# Patient Record
Sex: Male | Born: 1975 | Hispanic: No | Marital: Single | Smoking: Never smoker
Health system: Southern US, Community
[De-identification: ages and names within clinical notes are randomized; demographics above are authoritative.]

---

## 1998-01-09 ENCOUNTER — Emergency Department (HOSPITAL_COMMUNITY): Admission: EM | Admit: 1998-01-09 | Discharge: 1998-01-09 | Payer: Self-pay | Admitting: Emergency Medicine

## 2012-07-20 ENCOUNTER — Emergency Department (HOSPITAL_COMMUNITY)
Admission: EM | Admit: 2012-07-20 | Discharge: 2012-07-20 | Disposition: A | Discharge status: Home / Self Care | Payer: Self-pay | Attending: Emergency Medicine | Admitting: Emergency Medicine

## 2012-07-20 ENCOUNTER — Encounter (HOSPITAL_COMMUNITY): Payer: Self-pay | Admitting: Emergency Medicine

## 2012-07-20 ENCOUNTER — Emergency Department (HOSPITAL_COMMUNITY): Payer: Self-pay

## 2012-07-20 DIAGNOSIS — Y939 Activity, unspecified: Secondary | ICD-10-CM | POA: Insufficient documentation

## 2012-07-20 DIAGNOSIS — Y929 Unspecified place or not applicable: Secondary | ICD-10-CM | POA: Insufficient documentation

## 2012-07-20 DIAGNOSIS — S43004A Unspecified dislocation of right shoulder joint, initial encounter: Secondary | ICD-10-CM

## 2012-07-20 DIAGNOSIS — R209 Unspecified disturbances of skin sensation: Secondary | ICD-10-CM | POA: Insufficient documentation

## 2012-07-20 DIAGNOSIS — W010XXA Fall on same level from slipping, tripping and stumbling without subsequent striking against object, initial encounter: Secondary | ICD-10-CM | POA: Insufficient documentation

## 2012-07-20 DIAGNOSIS — S43006A Unspecified dislocation of unspecified shoulder joint, initial encounter: Secondary | ICD-10-CM | POA: Insufficient documentation

## 2012-07-20 MED ORDER — PROPOFOL 10 MG/ML IV BOLUS
INTRAVENOUS | Status: AC
  Administered 2012-07-20: 40 mg
  Filled 2012-07-20: qty 1

## 2012-07-20 MED ORDER — SODIUM CHLORIDE 0.9 % IV BOLUS (SEPSIS)
500.0000 mL | Freq: Once | INTRAVENOUS | Status: AC
  Administered 2012-07-20: 500 mL via INTRAVENOUS

## 2012-07-20 MED ORDER — IBUPROFEN 800 MG PO TABS: 800.0000 mg | ORAL_TABLET | Freq: Three times a day (TID) | ORAL | Status: AC | PRN

## 2012-07-20 MED ORDER — SODIUM CHLORIDE 0.9 % IV SOLN: INTRAVENOUS | Status: DC

## 2012-07-20 MED ORDER — HYDROMORPHONE HCL PF 2 MG/ML IJ SOLN
2.0000 mg | Freq: Once | INTRAMUSCULAR | Status: AC
  Administered 2012-07-20: 2 mg via INTRAMUSCULAR
  Filled 2012-07-20: qty 1

## 2012-07-20 MED ORDER — PROPOFOL 10 MG/ML IV EMUL
INTRAVENOUS | Status: AC | PRN
  Administered 2012-07-20: 4 mL via INTRAVENOUS

## 2012-07-20 MED ORDER — PROPOFOL 10 MG/ML IV EMUL
160.0000 mg/h | INTRAVENOUS | Status: DC
  Administered 2012-07-20: 40 mg/h via INTRAVENOUS

## 2012-07-20 NOTE — ED Notes (Signed)
Dr Fonnie Jarvis at bedside with patient. Remains in Triage 6

## 2012-07-20 NOTE — ED Notes (Signed)
States that he fell in the parking lot trying to catch himself injured his right shoulder. Obvious dislocation

## 2012-07-20 NOTE — ED Notes (Signed)
02 removed due to pt return back to baseline mental state. Will monitor 02 sats for changes. Pt remains on cardiac monitor with cont pulse ox.

## 2012-07-20 NOTE — ED Notes (Signed)
Steward sedation score 6

## 2012-07-20 NOTE — ED Provider Notes (Signed)
History     CSN: [id number]  Arrival date & time 07/20/12  1053   First MD Initiated Contact with Patient 07/20/12 1053      Chief Complaint  Patient presents with  . Dislocation  . Fall    (Consider location/radiation/quality/duration/timing/severity/associated sxs/prior treatment) The history is provided by the patient. No language interpreter was used.    37 year old male presents emergency department with complaint of shoulder pain.  Patient's primary language is [place].  He has a friend with him who translates.  Patient slipped on the ice just prior to arrival.  He had immediate pain in the right shoulder.  He thinks that it is dislocated.  He has never had an injury like this before.  He complains of severe pain in the right shoulder + numbness over the right deltoid.  He has no numbness or tingling.  Good grip strength.  History reviewed. No pertinent past medical history.  History reviewed. No pertinent past surgical history.  No family history on file.  History  Substance Use Topics  . Smoking status: Never Smoker   . Smokeless tobacco: Not on file  . Alcohol Use: Yes      Review of Systems Ten systems reviewed and are negative for acute change, except as noted in the HPI.     Allergies  Review of patient's allergies indicates no known allergies.  Home Medications  No current outpatient prescriptions on file.  BP 172/128  Pulse 64  Temp(Src) 98.4 F (36.9 C) (Oral)  Resp 20  SpO2 100%  Physical Exam Physical Exam  Nursing note and vitals reviewed. Constitutional: He appears well-developed and well-nourished. No distress.  HENT:  Head: Normocephalic and atraumatic.  Eyes: Conjunctivae normal are normal. No scleral icterus.  Neck: Normal range of motion. Neck supple.  Cardiovascular: Normal rate, regular rhythm and normal heart sounds.   Pulmonary/Chest: Effort normal and breath sounds normal. No respiratory distress.  Abdominal: Soft. There is  no tenderness.  Musculoskeletal: Right shoulder with obvious deformity.  Sulcus sign present.  Patient is neurovascularly intact.  Grip strength 5 out of 5.   Neurological: He is alert.  Skin: Skin is warm and dry. He is not diaphoretic.  Psychiatric: His behavior is normal.    ED Course  Procedures (including critical care time)  Labs Reviewed - No data to display No results found.   1. Shoulder dislocation, right, initial encounter       MDM  11:09 AM BP 172/128  Pulse 64  Temp(Src) 98.4 F (36.9 C) (Oral)  Resp 20  SpO2 100% Patient in obvious pain with right shoulder dislocation.  Giving him 2 mg IM Tylenol A.  Order IV placement this patient will probably need procedural sedation to attempt to relocate the arm.  This is his first dislocation.  Patient seen and shared visit with Dr. Fonnie Jarvis.   11:58 AM BP 148/84  Pulse 78  Temp(Src) 98.4 F (36.9 C) (Oral)  Resp 22  SpO2 99% Successful reduction of  Right shoulder. Please refer to note by Dr. Fonnie Jarvis. Patient will be please in sling immobilizer.  Followup with orthopedics.  Pain control at discharge.  Work note given. At this time there does not appear to be any evidence of an acute emergency medical condition and the patient appears stable for discharge with appropriate outpatient follow up.Diagnosis was discussed with patient who verbalizes understanding and is agreeable to discharge. Pt case discussed with Dr. Fonnie Jarvis who agrees with my plan.  Arthor Captain, PA-C 07/25/12 2047

## 2012-07-20 NOTE — ED Notes (Signed)
Pt has returned to baseline 02 at 2L 100%. Pt speaking to friend at bedside.

## 2012-07-20 NOTE — ED Provider Notes (Signed)
Medical screening examination/treatment/procedure(s) were conducted as a shared visit with non-physician practitioner(s) and myself.  I personally evaluated the patient during the encounter.  Airway patent and maintained. L:CTA bilat CV:RRR withotu murmur Abd: SNT Ext: Palpable right shoulder dislocation with slight decreased LT axillary nerve distribution with NT clavicle; distal NVI with intact LT and motor distribution of Median/radial/ulnar nerve function, CR<2secs right hand and NT right hand/wrist/elbow.  Pre-sedation "time-out," to be performed.  Nikhil Osei confirms review of the nurses' note, allergies, medications, PMH, pre-induction vital signs with pulse oximetry, pain level, pertinent labs, imaging, and ECG (as applicable) and patient condition satisfactory for commencing with order for sedation and procedure.  Agents used in sedation: titrated fentanyl/propofol.  Time-out taken immediately pre-procedure.  Patient tolerated procedure and procedural sedation component as expected without apparent immediate complications.  The patient is awake alert following commands and talking well.  Physician confirms procedural medication orders as administered, patient was assessed by physician post-procedure, and confirms post-sedation plan of care and disposition.[id number]  Procedure: Right shoulder dislocation reduced via closed technique by myself (2 RNs, 1 PA, 2 Ortho techs assisting) using titrated propofol for PSA.  Hurman Horn, MD 07/21/12 1341

## 2012-07-25 NOTE — ED Provider Notes (Signed)
PA note states Pt did not have [facility] after reduction, but to clarify the Pt presented with an [facility] (dislocated shoulder) that was stabilized via reduction for discharge.  Hurman Horn, MD 07/25/12 2223

## 2019-01-28 ENCOUNTER — Encounter (HOSPITAL_COMMUNITY): Payer: Self-pay | Admitting: Emergency Medicine

## 2019-01-28 ENCOUNTER — Emergency Department (HOSPITAL_COMMUNITY)
Admission: EM | Admit: 2019-01-28 | Discharge: 2019-01-28 | Disposition: A | Discharge status: Home / Self Care | Payer: Self-pay | Attending: Emergency Medicine | Admitting: Emergency Medicine

## 2019-01-28 ENCOUNTER — Other Ambulatory Visit: Payer: Self-pay

## 2019-01-28 ENCOUNTER — Emergency Department (HOSPITAL_COMMUNITY): Payer: Self-pay

## 2019-01-28 DIAGNOSIS — K1120 Sialoadenitis, unspecified: Secondary | ICD-10-CM | POA: Insufficient documentation

## 2019-01-28 LAB — CBC WITH DIFFERENTIAL/PLATELET
Abs Immature Granulocytes: 0.09 10*3/uL — ABNORMAL HIGH (ref 0.00–0.07)
Basophils Absolute: 0 10*3/uL (ref 0.0–0.1)
Basophils Relative: 0 %
Eosinophils Absolute: 0.1 10*3/uL (ref 0.0–0.5)
Eosinophils Relative: 1 %
HCT: 46 % (ref 39.0–52.0)
Hemoglobin: 15 g/dL (ref 13.0–17.0)
Immature Granulocytes: 1 %
Lymphocytes Relative: 20 %
Lymphs Abs: 3.3 10*3/uL (ref 0.7–4.0)
MCH: 30.2 pg (ref 26.0–34.0)
MCHC: 32.6 g/dL (ref 30.0–36.0)
MCV: 92.7 fL (ref 80.0–100.0)
Monocytes Absolute: 1 10*3/uL (ref 0.1–1.0)
Monocytes Relative: 6 %
Neutro Abs: 11.7 10*3/uL — ABNORMAL HIGH (ref 1.7–7.7)
Neutrophils Relative %: 72 %
Platelets: 324 10*3/uL (ref 150–400)
RBC: 4.96 MIL/uL (ref 4.22–5.81)
RDW: 12.7 % (ref 11.5–15.5)
WBC: 16.3 10*3/uL — ABNORMAL HIGH (ref 4.0–10.5)
nRBC: 0 % (ref 0.0–0.2)

## 2019-01-28 LAB — BASIC METABOLIC PANEL
Anion gap: 11 (ref 5–15)
BUN: 16 mg/dL (ref 6–20)
CO2: 22 mmol/L (ref 22–32)
Calcium: 9.4 mg/dL (ref 8.9–10.3)
Chloride: 104 mmol/L (ref 98–111)
Creatinine, Ser: 0.59 mg/dL — ABNORMAL LOW (ref 0.61–1.24)
GFR calc Af Amer: >60 mL/min (ref >60)
GFR calc non Af Amer: >60 mL/min (ref >60)
Glucose, Bld: 125 mg/dL — ABNORMAL HIGH (ref 70–99)
Potassium: 4 mmol/L (ref 3.5–5.1)
Sodium: 137 mmol/L (ref 135–145)

## 2019-01-28 LAB — GROUP A STREP BY PCR: Group A Strep by PCR: NOT DETECTED

## 2019-01-28 MED ORDER — CLINDAMYCIN HCL 150 MG PO CAPS: 450.0000 mg | ORAL_CAPSULE | Freq: Three times a day (TID) | ORAL | 0 refills | Status: AC

## 2019-01-28 MED ORDER — IOHEXOL 300 MG/ML  SOLN
75.0000 mL | Freq: Once | INTRAMUSCULAR | Status: AC | PRN
  Administered 2019-01-28: 75 mL via INTRAVENOUS

## 2019-01-28 MED ORDER — DEXAMETHASONE 4 MG PO TABS
10.0000 mg | ORAL_TABLET | Freq: Once | ORAL | Status: AC
  Administered 2019-01-28: 10 mg via ORAL
  Filled 2019-01-28: qty 3

## 2019-01-28 MED ORDER — CIPROFLOXACIN HCL 500 MG PO TABS: 500.0000 mg | ORAL_TABLET | Freq: Two times a day (BID) | ORAL | 0 refills | Status: AC

## 2019-01-28 NOTE — ED Triage Notes (Signed)
Patient reports sore throat x few days - swelling to L submandibular region. Endorses pain swallowing and eating. Denies fevers/chills.

## 2019-01-28 NOTE — Discharge Instructions (Addendum)
Please read attached information. If you experience any new or worsening signs or symptoms please return to the emergency room for evaluation. Please follow-up with your primary care provider or specialist as discussed. Please use medication prescribed only as directed and discontinue taking if you have any concerning signs or symptoms.  If after 10 days your symptoms have completely resolved you may discontinue using the antibiotics.  You may need to take them for up to 14 days.

## 2019-01-28 NOTE — ED Notes (Signed)
Patient verbalizes understanding of discharge instructions. Opportunity for questioning and answers were provided. Armband removed by staff, pt discharged from ED.   Interpreter used during discharge.   

## 2019-01-28 NOTE — ED Notes (Signed)
Patient transported to CT 

## 2019-01-28 NOTE — ED Provider Notes (Signed)
[facility] EMERGENCY DEPARTMENT Provider Note   CSN: [id number] Arrival date & time: 01/28/19  1039     History   Chief Complaint Chief Complaint  Patient presents with  . Throat Pain    HPI Frank Velasquez is a 43 y.o. male.     HPI    Video translator used  42 year old male presents today with complaints of pain and swelling to his throat.  Patient notes approximately 5 days ago he developed swelling to his posterior jaw by his wisdom tooth.  He notes swelling has spread down into his neck, he notes difficulty swallowing, pain in his throat with swallowing and pain under the jaw.  He denies any fever presently.  No history of the same.  He denies any chronic health conditions and does not take any medications.   History reviewed. No pertinent past medical history.  There are no active problems to display for this patient.   History reviewed. No pertinent surgical history.     Home Medications    Prior to Admission medications   Medication Sig Start Date End Date Taking? Authorizing Provider  ciprofloxacin (CIPRO) 500 MG tablet Take 1 tablet (500 mg total) by mouth every 12 (twelve) hours. 01/28/19   Carriann Hesse, Dellis Filbert, PA-C  clindamycin (CLEOCIN) 150 MG capsule Take 3 capsules (450 mg total) by mouth 3 (three) times daily. 01/28/19   Brayan Votaw, Dellis Filbert, PA-C  ibuprofen (ADVIL,MOTRIN) 800 MG tablet Take 1 tablet (800 mg total) by mouth every 8 (eight) hours as needed for pain. 07/20/12   Margarita Mail, PA-C    Family History No family history on file.  Social History Social History   Tobacco Use  . Smoking status: Never Smoker  . Smokeless tobacco: Never Used  Substance Use Topics  . Alcohol use: Yes  . Drug use: No     Allergies   Patient has no known allergies.   Review of Systems Review of Systems  All other systems reviewed and are negative.  Physical Exam Updated Vital Signs BP (!) 162/109 (BP Location: Left Arm)   Pulse 94    Temp 99 F (37.2 C) (Oral)   Resp 15   SpO2 97%   Physical Exam Vitals signs and nursing note reviewed.  Constitutional:      Appearance: He is well-developed.  HENT:     Head: Normocephalic and atraumatic.     Comments: Fullness noted in the left posterior oropharynx around the left tonsillar pillar, induration in the left submandibular region, gumline palpated without swelling or fluctuance, no pooling of secretions, very minor erythema in the oropharynx, no exudate -voice normal Eyes:     General: No scleral icterus.       Right eye: No discharge.        Left eye: No discharge.     Conjunctiva/sclera: Conjunctivae normal.     Pupils: Pupils are equal, round, and reactive to light.  Neck:     Musculoskeletal: Normal range of motion.     Vascular: No JVD.     Trachea: No tracheal deviation.  Pulmonary:     Effort: Pulmonary effort is normal.     Breath sounds: No stridor.  Neurological:     Mental Status: He is alert and oriented to person, place, and time.     Coordination: Coordination normal.  Psychiatric:        Behavior: Behavior normal.        Thought Content: Thought content normal.  Judgment: Judgment normal.    ED Treatments / Results  Labs (all labs ordered are listed, but only abnormal results are displayed) Labs Reviewed  CBC WITH DIFFERENTIAL/PLATELET - Abnormal; Notable for the following components:      Result Value   WBC 16.3 (*)    Neutro Abs 11.7 (*)    Abs Immature Granulocytes 0.09 (*)    All other components within normal limits  BASIC METABOLIC PANEL - Abnormal; Notable for the following components:   Glucose, Bld 125 (*)    Creatinine, Ser 0.59 (*)    All other components within normal limits  GROUP A STREP BY PCR    EKG None  Radiology Ct Soft Tissue Neck W Contrast  Result Date: 01/28/2019 CLINICAL DATA:  Mass, lump, or swelling to the face or neck. Patient reports sore throat and left submandibular swelling the past few days.  EXAM: CT NECK WITH CONTRAST TECHNIQUE: Multidetector CT imaging of the neck was performed using the standard protocol following the bolus administration of intravenous contrast. CONTRAST:  72mL OMNIPAQUE IOHEXOL 300 MG/ML  SOLN COMPARISON:  None. FINDINGS: Pharynx and larynx: Nasopharynx is within normal limits. Soft palate is normal. The palatine tonsils are enlarged bilaterally. No discrete mass lesion is present. Airway is slightly deviated to the right. There is some stranding in the left parapharyngeal fat. Asymmetric edema is present within the left piriform recess. Asymmetric edema extends into the posterior left hypopharynx is well. No discrete mass lesion is present. Vocal cords are midline and symmetric. The trachea is normal. Salivary glands: The parotid glands are within normal limits bilaterally. The right submandibular gland duct is scratched at there is some inflammatory change at the most inferior aspect of the right submandibular gland with thickening of the platysma muscle. The left submandibular gland is diffusely inflamed. Margins are indistinct. No discrete mass lesion is present. There is no duct dilation. Thyroid: Normal Lymph nodes: Reactive type bilateral submandibular and level 2 lymph nodes are present. There is mild prominence of level 3 lymph nodes on the left. No malignant adenopathy is present. Vascular: No significant vascular disease is evident. Limited intracranial: Within normal limits Visualized orbits: The globes and orbits are within normal limits. Mastoids and visualized paranasal sinuses: Mucosal thickening and polyps or mucous retention cysts are present along the floor of the maxillary sinuses bilaterally. There is lucency along the roots of the left maxillary second molar. Also lucency about the roots of the left third mandibular molar. This does not appear to be the source of infection. Skeleton: There straightening and reversal of normal cervical lordosis. No significant  listhesis is present. No focal lytic or blastic lesions are present. Upper chest: The lung apices are clear. Thoracic inlet is within normal limits. IMPRESSION: 1. Marked inflammatory changes centered at the left submandibular gland suggesting a primary sialadenitis. No duct obstruction is evident. 2. Mild inflammatory changes along the inferior right submandibular gland. 3. Reactive bilateral adenopathy. Asymmetric inflammatory changes involving the inferior aspect of the left palatine tonsil into the hypopharynx on the left. 4. No discrete abscess. Electronically Signed   By: Marin Roberts M.D.   On: 01/28/2019 17:29    Procedures Procedures (including critical care time)  Medications Ordered in ED Medications  dexamethasone (DECADRON) tablet 10 mg (10 mg Oral Given 01/28/19 1457)  iohexol (OMNIPAQUE) 300 MG/ML solution 75 mL (75 mLs Intravenous Contrast Given 01/28/19 1645)     Initial Impression / Assessment and Plan / ED Course  I have  reviewed the triage vital signs and the nursing notes.  Pertinent labs & imaging results that were available during my care of the patient were reviewed by me and considered in my medical decision making (see chart for details).        Labs: CBC, BMP, group A strep  Imaging: CT soft tissue neck with contrast  Consults:  Therapeutics:  Discharge Meds: Clinda, Cipro  Assessment/Plan: Patient presentation is most consistent with cellulitis.  There are no purulent findings, no abscess, he does not have reactive bilateral adenopathy.  Patient is tolerating secretions in no acute distress, he has no fever or significant tachycardia.  Given inflammatory findings and elevated white count presumable infectious etiology.  Patient will be discharged home on clindamycin and Cipro, he will follow-up in 2 days if symptoms are not improving or immediately if they worsen.  Patient given strict return precautions.  Verbalized understanding and agreement to  today's plan had no further questions or concerns.   Final Clinical Impressions(s) / ED Diagnoses   Final diagnoses:  Sialadenitis    ED Discharge Orders         Ordered    ciprofloxacin (CIPRO) 500 MG tablet  Every 12 hours     01/28/19 1814    clindamycin (CLEOCIN) 150 MG capsule  3 times daily     01/28/19 1814           Eyvonne MechanicHedges, Adalee Kathan, PA-C 01/28/19 1816    Cathren LaineSteinl, Kevin, MD 01/29/19 [phone]

## 2020-11-16 IMAGING — CT CT NECK W/ CM
4 of 5 series · 14 of 33 positions shown, 16 images · IV contrast (APPLIED)
Comparison: None.

CLINICAL DATA: Mass, lump, or swelling to the face or neck. Patient
reports sore throat and left submandibular swelling the past few
days.

EXAM:
CT NECK WITH CONTRAST
TECHNIQUE: Multidetector CT imaging of the neck was performed using the
standard protocol following the bolus administration of intravenous
contrast.
CONTRAST:  75mL OMNIPAQUE IOHEXOL 300 MG/ML  SOLN

[Series 3: neck 2.0 i31s 3 · axial · 0.48mm/px · z∈[-196,-40]mm · 4 of 130 slices shown, 5 images]
[im 26/130  soft-tissue]
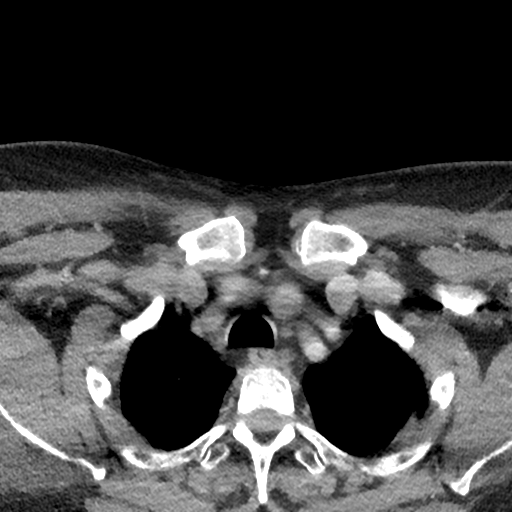
[im 26/130  bone]
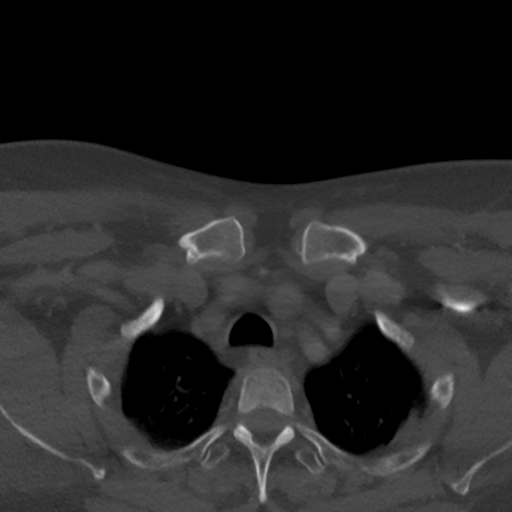
[im 52/130  bone]
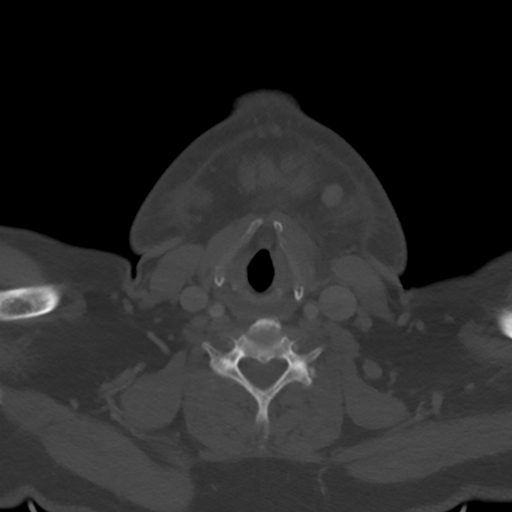
[im 78/130  bone]
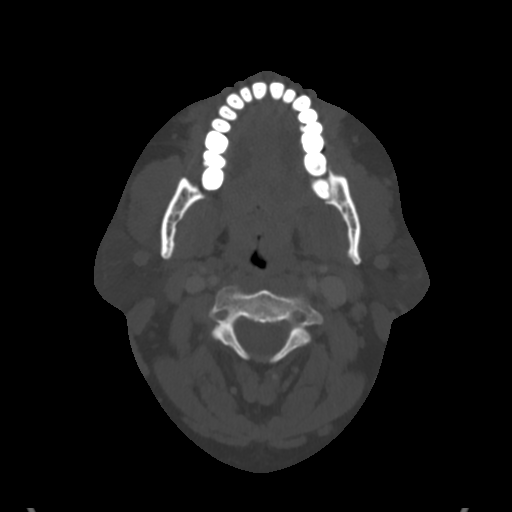
[im 104/130  bone]
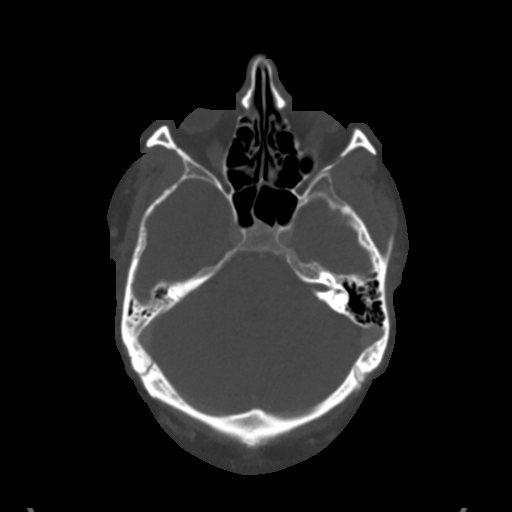

[Series 6: coronal st · coronal · 0.51mm/px · 3 of 125 slices shown]
[im 25/125  bone]
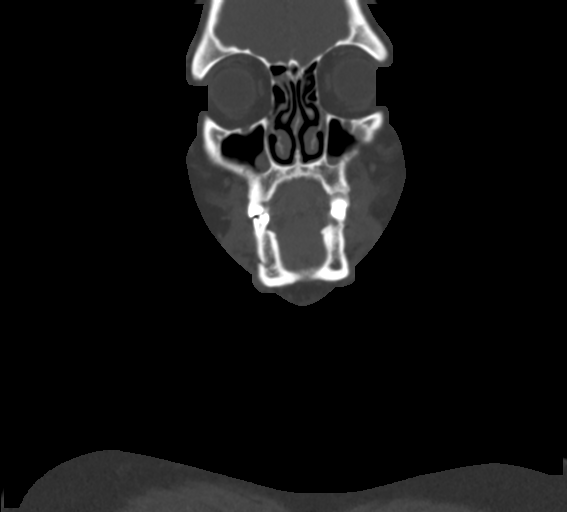
[im 50/125  bone]
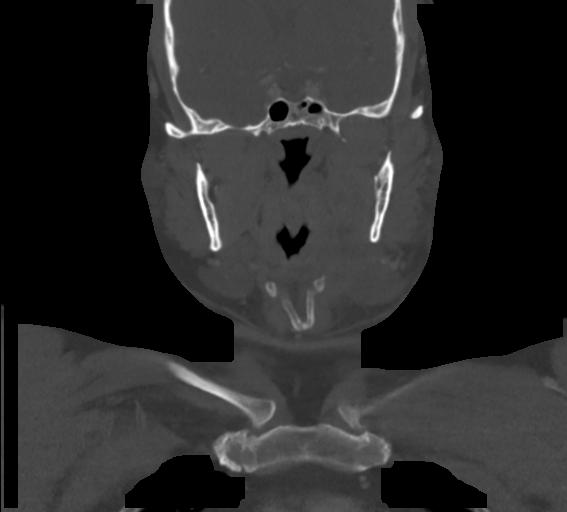
[im 75/125  bone]
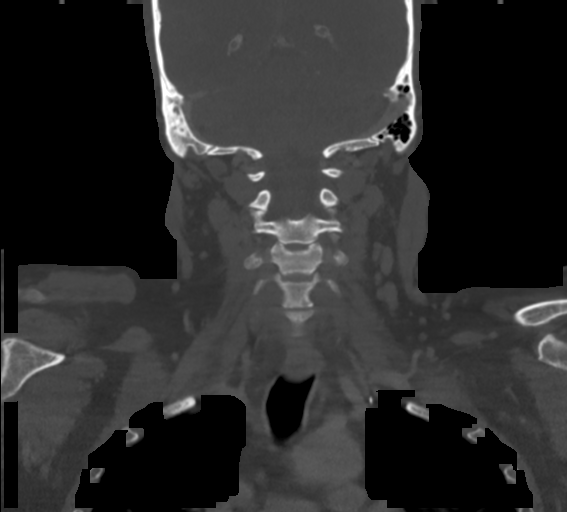

[Series 7: sagittal st · sagittal · 0.51mm/px · 5 of 101 slices shown, 6 images]
[im 34/101  bone]
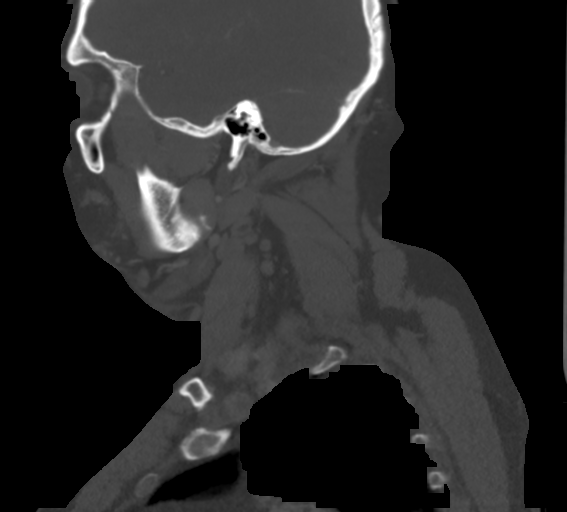
[im 42/101  bone]
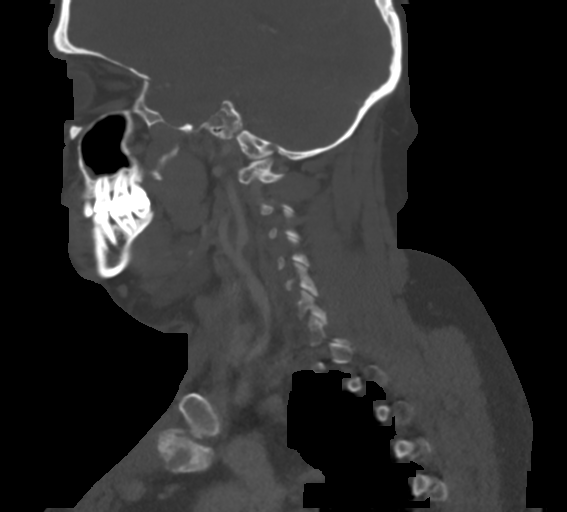
[im 51/101  soft-tissue]
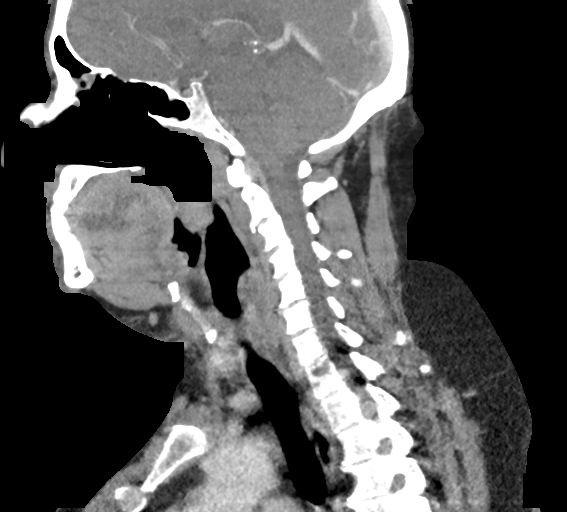
[im 51/101  bone]
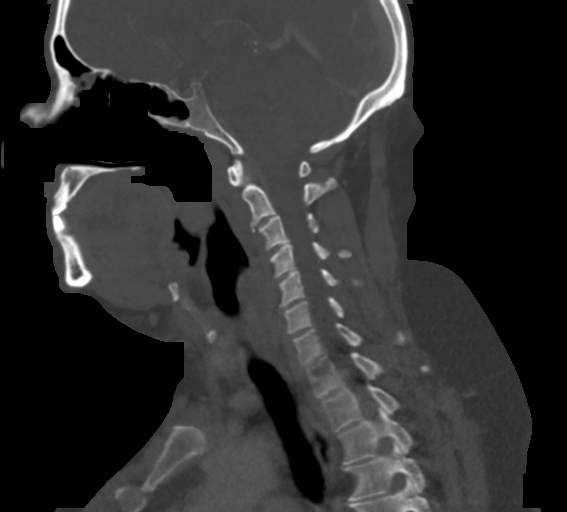
[im 59/101  bone]
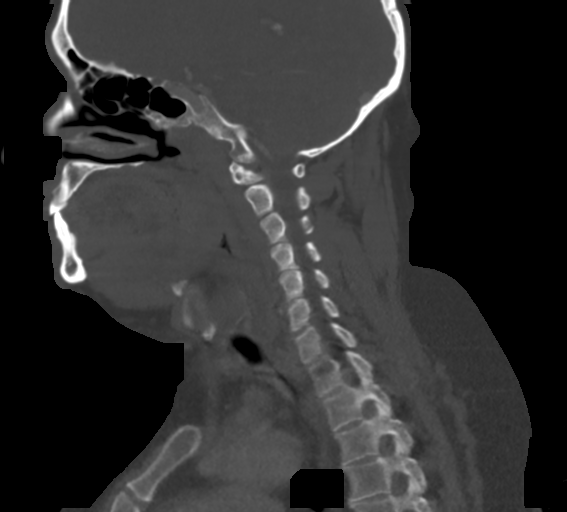
[im 67/101  bone]
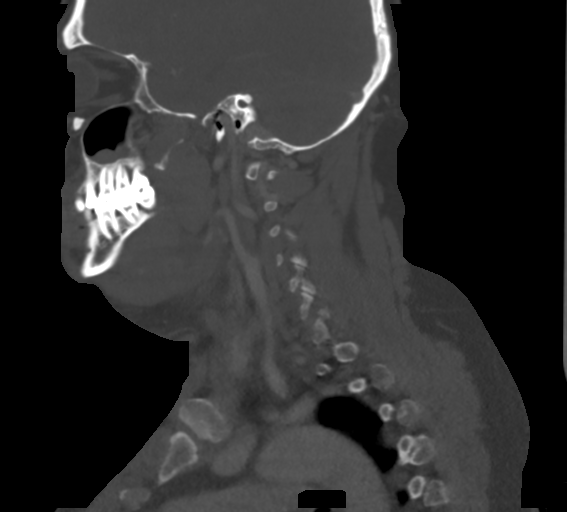

[Series 8: orthogonal st · axial · 0.39mm/px · z∈[-195,-143]mm · 2 of 131 slices shown]
[im 27/131  bone]
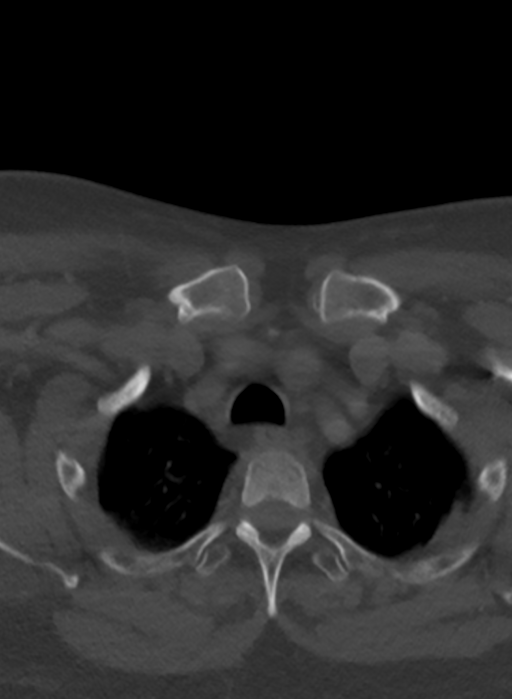
[im 53/131  bone]
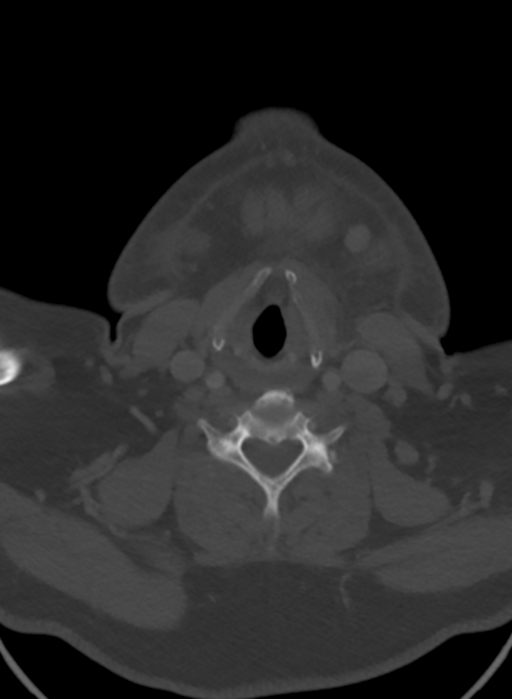

[14 of 33 positions shown; findings below may reference images not displayed]

FINDINGS: Pharynx and larynx: Nasopharynx is within normal limits. Soft palate
is normal. The palatine tonsils are enlarged bilaterally. No
discrete mass lesion is present. Airway is slightly deviated to the
right. There is some stranding in the left parapharyngeal fat.
Asymmetric edema is present within the left piriform recess.
Asymmetric edema extends into the posterior left hypopharynx is
well. No discrete mass lesion is present. Vocal cords are midline
and symmetric. The trachea is normal.

Salivary glands: The parotid glands are within normal limits
bilaterally. The right submandibular gland duct is scratched at
there is some inflammatory change at the most inferior aspect of the
right submandibular gland with thickening of the platysma muscle.

The left submandibular gland is diffusely inflamed. Margins are
indistinct. No discrete mass lesion is present. There is no duct
dilation.

Thyroid: Normal

Lymph nodes: Reactive type bilateral submandibular and level 2 lymph
nodes are present. There is mild prominence of level 3 lymph nodes
on the left. No malignant adenopathy is present.

Vascular: No significant vascular disease is evident.

Limited intracranial: Within normal limits

Visualized orbits: The globes and orbits are within normal limits.

Mastoids and visualized paranasal sinuses: Mucosal thickening and
polyps or mucous retention cysts are present along the floor of the
maxillary sinuses bilaterally. There is lucency along the roots of
the left maxillary second molar. Also lucency about the roots of the
left third mandibular molar. This does not appear to be the source
of infection.

Skeleton: There straightening and reversal of normal cervical
lordosis. No significant listhesis is present. No focal lytic or
blastic lesions are present.

Upper chest: The lung apices are clear. Thoracic inlet is within
normal limits.
IMPRESSION: 1. Marked inflammatory changes centered at the left submandibular
gland suggesting a primary sialadenitis. No duct obstruction is
evident.
2. Mild inflammatory changes along the inferior right submandibular
gland.
3. Reactive bilateral adenopathy. Asymmetric inflammatory changes
involving the inferior aspect of the left palatine tonsil into the
hypopharynx on the left.
4. No discrete abscess.

## 2022-08-12 ENCOUNTER — Encounter (HOSPITAL_COMMUNITY): Payer: Self-pay | Admitting: Emergency Medicine

## 2022-08-12 ENCOUNTER — Emergency Department (HOSPITAL_COMMUNITY)
Admission: EM | Admit: 2022-08-12 | Discharge: 2022-08-13 | Discharge status: Against Medical Advice | Payer: Self-pay | Attending: Emergency Medicine | Admitting: Emergency Medicine

## 2022-08-12 ENCOUNTER — Other Ambulatory Visit: Payer: Self-pay

## 2022-08-12 DIAGNOSIS — K649 Unspecified hemorrhoids: Secondary | ICD-10-CM | POA: Insufficient documentation

## 2022-08-12 DIAGNOSIS — Z5321 Procedure and treatment not carried out due to patient leaving prior to being seen by health care provider: Secondary | ICD-10-CM | POA: Insufficient documentation

## 2022-08-12 DIAGNOSIS — I10 Essential (primary) hypertension: Secondary | ICD-10-CM | POA: Insufficient documentation

## 2022-08-12 NOTE — ED Triage Notes (Signed)
Pt here for concern over bump near his rectum. Denies rectal bleeding and/or drainage.

## 2022-08-12 NOTE — ED Provider Triage Note (Signed)
Emergency Medicine Provider Triage Evaluation Note  Frank Velasquez , a 47 y.o. male  was evaluated in triage.  Pt complains of a bump around his anus.  He felt it when he wipes.  He has no significant pain.  He is notably hypertensive but states it is because he is nervous.  He does not follow with doctors regularly.  He denies any rectal bleeding..  Review of Systems  Positive: Bump around the anus Negative: Rectal bleeding  Physical Exam  BP (!) 207/113   Pulse (!) 101   Temp 98.5 F (36.9 C)   Resp 18   SpO2 96%  Gen:   Awake, no distress    Resp:  Normal effort   MSK:   Moves extremities without difficulty   Other:     Medical Decision Making  Medically screening exam initiated at 7:38 PM.  Appropriate orders placed.  Michale Corniel was informed that the remainder of the evaluation will be completed by another provider, this initial triage assessment does not replace that evaluation, and the importance of remaining in the ED until their evaluation is complete.     Margarita Mail, PA-C 08/12/22 1939

## 2022-08-13 NOTE — ED Notes (Signed)
Pt called for room multiple times, no response 

## 2022-08-14 ENCOUNTER — Encounter (HOSPITAL_COMMUNITY): Payer: Self-pay

## 2022-08-14 ENCOUNTER — Emergency Department (HOSPITAL_COMMUNITY)
Admission: EM | Admit: 2022-08-14 | Discharge: 2022-08-14 | Disposition: A | Discharge status: Home / Self Care | Payer: Self-pay | Attending: Emergency Medicine | Admitting: Emergency Medicine

## 2022-08-14 DIAGNOSIS — R030 Elevated blood-pressure reading, without diagnosis of hypertension: Secondary | ICD-10-CM | POA: Insufficient documentation

## 2022-08-14 DIAGNOSIS — K644 Residual hemorrhoidal skin tags: Secondary | ICD-10-CM

## 2022-08-14 DIAGNOSIS — K648 Other hemorrhoids: Secondary | ICD-10-CM | POA: Insufficient documentation

## 2022-08-14 MED ORDER — NIFEDIPINE 0.3 % OINTMENT: 1.0000 | TOPICAL_OINTMENT | Freq: Four times a day (QID) | CUTANEOUS | 0 refills | Status: AC

## 2022-08-14 NOTE — ED Triage Notes (Signed)
Pt arrives today c/o hemorrhoids around his anus. Denies blood, endorses pain when wiping.

## 2022-08-14 NOTE — Discharge Instructions (Addendum)
Thank you for letting us take care of you today.  You have multiple external hemorrhoids but no bleeding or other complications noted when examining them. I am sending you home with information for treating these as well as medication to use for them. Please use this as prescribed.  You should follow up with the surgeon as above for possible removal of the hemorrhoids. You should also follow up with gastroenterology for evaluation and/or possible colonoscopy.  Your blood pressure was very elevated today. It is very important you establish care with a primary care provider for long-term control of this and any chronic health conditions. I provided 2 primary care clinics you may follow up with or you may go to a primary care provider of your own choosing.   For any new or worsening symptoms such as abdominal pain, fever, rectal bleeding, vomiting, inability to have a bowel movement, or other new, concerning symptoms, please return to nearest emergency department for re-evaluation.   Gracias por dejarnos cuidar de usted hoy.  Tiene mltiples hemorroides externas pero no se observa sangrado ni otras complicaciones al examinarlas. Los envo a casa con informacin para tratarlos y tambin con medicamentos para tratarlos. Utilice esto segn lo prescrito.  Debe hacer un seguimiento con el cirujano como se indic anteriormente para una posible eliminacin de las hemorroides. Tambin se debe realizar un seguimiento con gastroenterologa para evaluacin y/o posible colonoscopia.  Su presin arterial estaba muy elevada hoy. Es muy importante que establezca atencin con un proveedor de atencin primaria para el control a largo plazo de esta y Higher education careers adviser condicin de salud crnica. Le proporcion 2 clnicas de atencin primaria con las que puede realizar un seguimiento o puede acudir a un proveedor de atencin primaria de su eleccin.  Ante cualquier sntoma nuevo o que empeore, como dolor abdominal, fiebre,  sangrado rectal, vmitos, incapacidad para defecar u otros sntomas nuevos preocupantes, regrese al departamento de emergencias ms cercano para una nueva evaluacin.

## 2022-08-15 NOTE — ED Provider Notes (Signed)
[place] AT [facility] Provider Note   CSN: [id number] Arrival date & time: 08/14/22  [id number]     History  Chief Complaint  Patient presents with   Hemorrhoids    Frank Velasquez is a 47 y.o. male with no PMH who presents to ED with daughter for evaluation of rectal pain. Pt concerned for hemorrhoids. No prior history of these. Pt denies rectal bleeding, constipation, diarrhea, abdominal pain, nausea, vomiting, fever, chills, chest pain, shortness of breath, or other symptoms. He is not regularly followed by an outpatient care team. No previous colonoscopy or EGD. No medications tried at home.       Home Medications No regular prescription medications  Allergies    Patient has no known allergies.    Review of Systems   Review of Systems  All other systems reviewed and are negative.   Physical Exam Updated Vital Signs BP (!) 156/94   Pulse 69   Temp 98.2 F (36.8 C) (Oral)   Resp 18   Wt 81.6 kg   SpO2 96%  Physical Exam Vitals and nursing note reviewed. Exam conducted with a chaperone present.  Constitutional:      General: He is not in acute distress.    Appearance: Normal appearance.  HENT:     Head: Normocephalic and atraumatic.     Mouth/Throat:     Mouth: Mucous membranes are moist.  Eyes:     Conjunctiva/sclera: Conjunctivae normal.  Cardiovascular:     Rate and Rhythm: Normal rate and regular rhythm.     Heart sounds: No murmur heard. Pulmonary:     Effort: Pulmonary effort is normal.     Breath sounds: Normal breath sounds.  Abdominal:     General: Abdomen is flat. There is no distension.     Palpations: Abdomen is soft. There is no mass.     Tenderness: There is no abdominal tenderness. There is no right CVA tenderness, left CVA tenderness, guarding or rebound.     Hernia: No hernia is present.  Genitourinary:    Rectum: External hemorrhoid present. No mass, tenderness or anal fissure. Normal anal tone.      Comments: Rectal exam completed with paramedic at bedside as chaperon, no gross bleeding or other signs of bleeding, no tenderness, multiple small external, nonthrombosed hemorrhoids, no other abnormalities identified Musculoskeletal:        General: Normal range of motion.     Cervical back: Neck supple.     Right lower leg: No edema.     Left lower leg: No edema.  Skin:    General: Skin is warm and dry.     Capillary Refill: Capillary refill takes less than 2 seconds.  Neurological:     Mental Status: He is alert. Mental status is at baseline.  Psychiatric:        Behavior: Behavior normal.    ED Results / Procedures / Treatments   Labs (all labs ordered are listed, but only abnormal results are displayed) Labs Reviewed - No data to display  EKG None  Radiology No results found.  Procedures Procedures   Medications Ordered in ED Medications - No data to display  ED Course/ Medical Decision Making/ A&P                           Medical Decision Making  Medical Decision Making:   Frank Velasquez is a 47 y.o. male who presented to the  ED today with rectal pain detailed above.    Additional history discussed with patient's family/caregivers.  Complete initial physical exam performed, notably the patient  was in no acute distress. Abdomen soft and nontender. No abdominal distension. No rebound, guarding, or peritoneal signs. Pt nontoxic appearing. Multiple small, nonthrombosed external hemorrhoids but no rectal bleeding, no anal fissure.    Reviewed and confirmed nursing documentation for past medical history, family history, social history.    Initial Assessment:   With the patient's presentation of rectal pain, differential diagnosis includes but is not limited to external hemorrhoids, internal hemorrhoids, anal fissure, constipation, cellulitis, rectal trauma, bowel obstruction, bowel perforation, GI bleed, acute abdomen.  This is most consistent with an acute complicated  illness  Final Assessment and Plan:   This is a 47 year old male who presents to ED for evaluation of rectal pain. No trauma to area. No rectal bleeding, abdominal pain, fever, diarrhea, or constipation. Abdomen soft, nontender. No rebound, guarding, or peritoneal signs. Pt nontoxic appearing. Multiple small nonthrombosed external hemorrhoids on exam but no significant tenderness, no bleeding, no anal fissure, no other abnormalities on exam. Pt without other complaint. No outpatient care team. Remarkably hypertensive today. Extensive discussion with pt and daughter at bedside regarding need for follow up with primary care for recheck of blood pressure and possible prescription management. Pt without headache, vision changes, or focal weakness. Neurologically intact. Pt agreeable to follow up in outpatient setting which I feel is reasonable with his lack of other symptoms. Discussed conservative management of hemorrhoids as well as provided follow up with GI for colonoscopy and/or surgery for possible removal. Pt expressed understanding of this. Strict ED return precautions given, all questions answered, and stable for discharge.     Clinical Impression:  1. External hemorrhoids   2. Elevated blood-pressure reading without diagnosis of hypertension      Discharge     Final Clinical Impression(s) / ED Diagnoses Final diagnoses:  External hemorrhoids  Elevated blood-pressure reading without diagnosis of hypertension    Rx / DC Orders ED Discharge Orders          Ordered    nifedipine 0.3 % ointment  4 times daily        08/14/22 1029              Suzzette Righter, PA-C 08/15/22 1756    Regan Lemming, MD 08/15/22 2001

## 2024-06-12 ENCOUNTER — Emergency Department (HOSPITAL_COMMUNITY): Payer: Self-pay

## 2024-06-12 ENCOUNTER — Emergency Department (HOSPITAL_COMMUNITY)
Admission: EM | Admit: 2024-06-12 | Discharge: 2024-06-12 | Disposition: A | Discharge status: Home / Self Care | Payer: Self-pay | Attending: Emergency Medicine | Admitting: Emergency Medicine

## 2024-06-12 DIAGNOSIS — S46912A Strain of unspecified muscle, fascia and tendon at shoulder and upper arm level, left arm, initial encounter: Secondary | ICD-10-CM | POA: Insufficient documentation

## 2024-06-12 DIAGNOSIS — W000XXA Fall on same level due to ice and snow, initial encounter: Secondary | ICD-10-CM | POA: Insufficient documentation

## 2024-06-12 MED ORDER — ACETAMINOPHEN 325 MG PO TABS
650.0000 mg | ORAL_TABLET | Freq: Once | ORAL | Status: AC
  Administered 2024-06-12: 650 mg via ORAL
  Filled 2024-06-12: qty 2

## 2024-06-12 NOTE — ED Provider Notes (Signed)
 " [place] [facility] AT [facility] Provider Note   CSN: [id number] Arrival date & time: 06/12/24  [id number]     Patient presents with: [facility] John is a 49 y.o. male  Pt complains of fall, left shoulder injury. No blood thinners, no head injury. Slipped on ice. Denies any other injury. No numbness, tingling.    Fall       Prior to Admission medications  Medication Sig Start Date End Date Taking? Authorizing Provider  ciprofloxacin  (CIPRO ) 500 MG tablet Take 1 tablet (500 mg total) by mouth every 12 (twelve) hours. 01/28/19   Hedges, Reyes, PA-C  clindamycin  (CLEOCIN ) 150 MG capsule Take 3 capsules (450 mg total) by mouth 3 (three) times daily. 01/28/19   Hedges, Reyes, PA-C  ibuprofen  (ADVIL ,MOTRIN ) 800 MG tablet Take 1 tablet (800 mg total) by mouth every 8 (eight) hours as needed for pain. 07/20/12   Harris, Abigail, PA-C    Allergies: Patient has no known allergies.    Review of Systems  All other systems reviewed and are negative.   Updated Vital Signs BP (!) 159/133   Pulse 84   Temp 98.6 F (37 C) (Oral)   Resp 18   SpO2 98%   Physical Exam Vitals and nursing note reviewed.  Constitutional:      General: He is not in acute distress.    Appearance: Normal appearance.  HENT:     Head: Normocephalic and atraumatic.  Eyes:     General:        Right eye: No discharge.        Left eye: No discharge.  Cardiovascular:     Rate and Rhythm: Normal rate and regular rhythm.     Pulses: Normal pulses.     Heart sounds: No murmur heard.    No friction rub. No gallop.     Comments: Radial, ulnar pulses 2+ in left upper extremity Pulmonary:     Effort: Pulmonary effort is normal.     Breath sounds: Normal breath sounds.  Abdominal:     General: Bowel sounds are normal.     Palpations: Abdomen is soft.  Musculoskeletal:     Comments: Focal tenderness to palpation of the left shoulder with no step-off, deformity.  Intact crossarm  adduction.  Overall some pain with passive flexion, extension.  No significant tenderness in cervical paraspinous muscles on the left, no tenderness of the clavicle.  Skin:    General: Skin is warm and dry.     Capillary Refill: Capillary refill takes less than 2 seconds.  Neurological:     Mental Status: He is alert and oriented to person, place, and time.  Psychiatric:        Mood and Affect: Mood normal.        Behavior: Behavior normal.     (all labs ordered are listed, but only abnormal results are displayed) Labs Reviewed - No data to display  EKG: None  Radiology: DG Shoulder Left Result Date: 06/12/2024 CLINICAL DATA:  Fall, left shoulder pain. EXAM: LEFT SHOULDER - 2+ VIEW COMPARISON:  None Available. FINDINGS: No acute osseous or joint abnormality. Mild degenerative changes in the left acromioclavicular joint. Visualized left chest is grossly unremarkable. IMPRESSION: 1. No acute findings. 2. Mild left acromioclavicular joint osteoarthritis. Electronically Signed   By: Newell Eke M.D.   On: 06/12/2024 11:31     Procedures   Medications Ordered in the ED  acetaminophen  (TYLENOL ) tablet 650 mg (650  mg Oral Given 06/12/24 0845)                                    Medical Decision Making Amount and/or Complexity of Data Reviewed Radiology: ordered.  Risk OTC drugs.   This patient is a 49 y.o. male who presents to the ED for concern of fall, left shoulder pain.   Differential diagnoses prior to evaluation: Fracture, dislocation, rotator cuff injury, strain  Past Medical History / Social History / Additional history: Chart reviewed. Pertinent results include: Overall noncontributory  Physical Exam: Physical exam performed. The pertinent findings include: Focal tenderness to palpation of the left shoulder with no step-off, deformity.  Intact crossarm adduction.  Overall some pain with passive flexion, extension.  No significant tenderness in cervical  paraspinous muscles on the left, no tenderness of the clavicle.   Radial, ulnar pulses 2+ in left upper extremity  I independently interpreted imaging including plain film radiograph of the left shoulder which shows no evidence of acute fracture, dislocation, some ac arthritis changes noted. I agree with the radiologist interpretation.  Medications / Treatment: Suspect shoulder strain/rotator cuff injury, provided shoulder sling, encouraged ibuprofen , Tylenol , orthopedic follow-up   Disposition: After consideration of the diagnostic results and the patients response to treatment, I feel that patient stable for discharge with shoulder strain as above, sling provided.   emergency department workup does not suggest an emergent condition requiring admission or immediate intervention beyond what has been performed at this time. The plan is: as above. The patient is safe for discharge and has been instructed to return immediately for worsening symptoms, change in symptoms or any other concerns.   Final diagnoses:  Strain of left shoulder, initial encounter    ED Discharge Orders     None          Rosan Sherlean DEL, PA-C 06/12/24 1151    Ruthe Cornet, DO 06/12/24 1152  "

## 2024-06-12 NOTE — ED Provider Triage Note (Signed)
 Emergency Medicine Provider Triage Evaluation Note  Frank Velasquez , a 49 y.o. male  was evaluated in triage.  Pt complains of fall, left shoulder injury. No blood thinners, no head injury.  Review of Systems  Positive: Shoulder pain Negative:   Physical Exam  BP (!) 159/133   Pulse 84   Temp 98.6 F (37 C) (Oral)   Resp 18   SpO2 98%  Gen:   Awake, no distress   Resp:  Normal effort  MSK:   Moves extremities without difficulty, focal ttp of the left shoulder. Normal range of motion, normal cross arm adduction Other:  Radial, ulnar pulses 2+ in the affected left upper extremity  Medical Decision Making  Medically screening exam initiated at 8:41 AM.  Appropriate orders placed.  Frank Velasquez was informed that the remainder of the evaluation will be completed by another provider, this initial triage assessment does not replace that evaluation, and the importance of remaining in the ED until their evaluation is complete.  Workup initiated in triage    Frank Velasquez DEL, [place] 06/12/24 [phone]

## 2024-06-12 NOTE — Discharge Instructions (Signed)
 Please use Tylenol or ibuprofen for pain.  You may use 600 mg ibuprofen every 6 hours or 1000 mg of Tylenol every 6 hours.  You may choose to alternate between the 2.  This would be most effective.  Not to exceed 4 g of Tylenol within 24 hours.  Not to exceed 3200 mg ibuprofen 24 hours.

## 2024-06-12 NOTE — Progress Notes (Signed)
 Orthopedic Tech Progress Note Patient Details:  Frank Velasquez 08-Feb-1976 [id number]  Ortho Devices Type of Ortho Device: Shoulder immobilizer Ortho Device/Splint Location: left Ortho Device/Splint Interventions: Ordered, Application, Adjustment   Post Interventions Patient Tolerated: Well Instructions Provided: Adjustment of device, Care of device  Frank Velasquez 06/12/2024, 12:16 PM

## 2024-06-12 NOTE — ED Triage Notes (Addendum)
 Patient slipped on ice last night, denies hitting head/LOC/thinners. C/o right shoulder pain. Patient is alert and oriented x 4. Airway patent, respirations even and unlabored. Skin normal, warm and dry.  Palpable right radial pulse 2+, no deformities seen.
